# Patient Record
Sex: Female | Born: 1985 | Race: White | Hispanic: No | Marital: Single | Smoking: Never smoker
Health system: Southern US, Community
[De-identification: ages and names within clinical notes are randomized; demographics above are authoritative.]

---

## 2016-02-26 ENCOUNTER — Emergency Department (HOSPITAL_COMMUNITY)
Admission: EM | Admit: 2016-02-26 | Discharge: 2016-02-26 | Disposition: A | Discharge status: Home / Self Care | Payer: 59 | Attending: Emergency Medicine | Admitting: Emergency Medicine

## 2016-02-26 ENCOUNTER — Encounter (HOSPITAL_COMMUNITY): Payer: Self-pay | Admitting: Emergency Medicine

## 2016-02-26 DIAGNOSIS — Y92488 Other paved roadways as the place of occurrence of the external cause: Secondary | ICD-10-CM | POA: Diagnosis not present

## 2016-02-26 DIAGNOSIS — M545 Low back pain: Secondary | ICD-10-CM | POA: Insufficient documentation

## 2016-02-26 DIAGNOSIS — Y999 Unspecified external cause status: Secondary | ICD-10-CM | POA: Diagnosis not present

## 2016-02-26 DIAGNOSIS — Y939 Activity, unspecified: Secondary | ICD-10-CM | POA: Diagnosis not present

## 2016-02-26 MED ORDER — NAPROXEN 500 MG PO TABS: 500.0000 mg | ORAL_TABLET | Freq: Two times a day (BID) | ORAL | Status: AC

## 2016-02-26 MED ORDER — CYCLOBENZAPRINE HCL 10 MG PO TABS: 10.0000 mg | ORAL_TABLET | Freq: Two times a day (BID) | ORAL | Status: AC | PRN
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
## 2016-02-26 NOTE — ED Notes (Signed)
Pt ambulated to BR without diffculty
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
## 2016-02-26 NOTE — Discharge Instructions (Signed)
You'll be sore for several days. Medication for muscle spasm and anti-inflammatory effect.

## 2017-04-23 ENCOUNTER — Other Ambulatory Visit: Payer: Self-pay | Admitting: Otolaryngology

## 2017-04-23 DIAGNOSIS — J329 Chronic sinusitis, unspecified: Secondary | ICD-10-CM

## 2017-04-26 ENCOUNTER — Ambulatory Visit
Admission: RE | Admit: 2017-04-26 | Discharge: 2017-04-26 | Disposition: A | Discharge status: Home / Self Care | Payer: 59 | Source: Ambulatory Visit | Attending: Otolaryngology | Admitting: Otolaryngology

## 2017-04-26 DIAGNOSIS — J329 Chronic sinusitis, unspecified: Secondary | ICD-10-CM

## 2018-06-16 IMAGING — CT CT MAXILLOFACIAL W/O CM
1 series · 15 of 30 positions shown, 19 images · non-contrast
Comparison: None.

CLINICAL DATA: 31 y/o F; chronic sinusitis for years. Finished
antibiotics. Persistent postnasal drip and cough.

EXAM:
CT MAXILLOFACIAL WITHOUT CONTRAST
TECHNIQUE: Multidetector CT images of the paranasal sinuses were obtained using
the standard protocol without intravenous contrast.

[Series 4: soft tissue · axial · 0.44mm/px · z∈[-198,-40]mm · 15 of 171 slices shown, 19 images]
[im 6/171  brain]
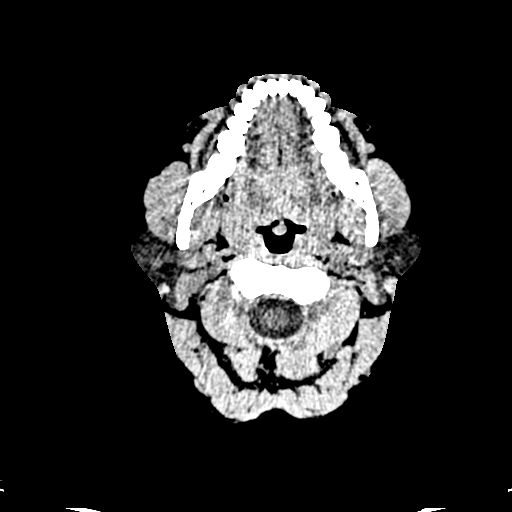
[im 6/171  bone]
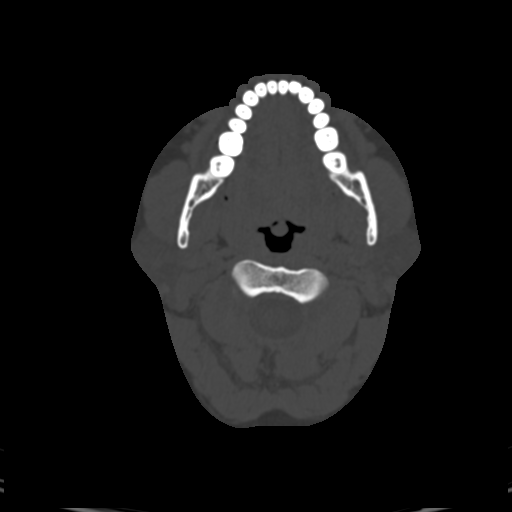
[im 18/171  bone]
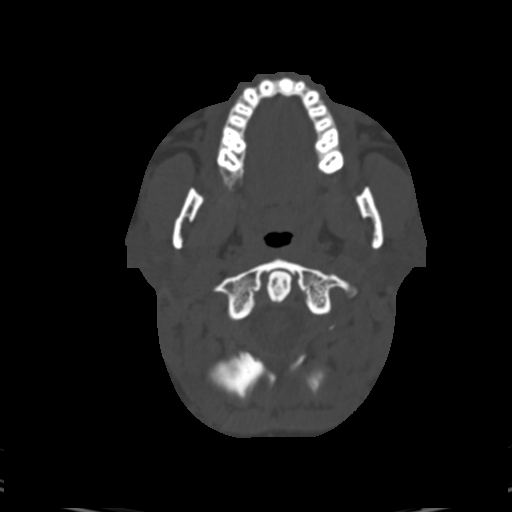
[im 30/171  bone]
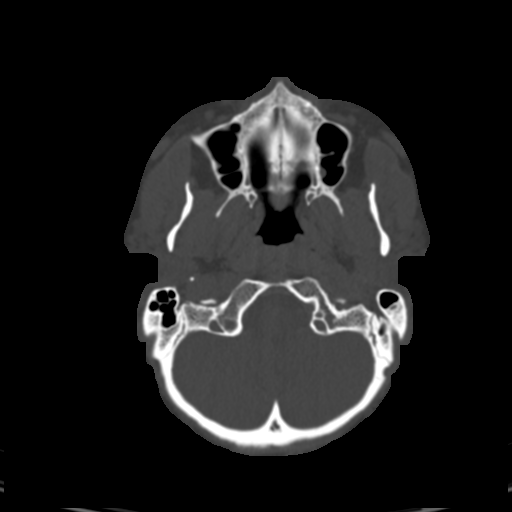
[im 42/171  bone]
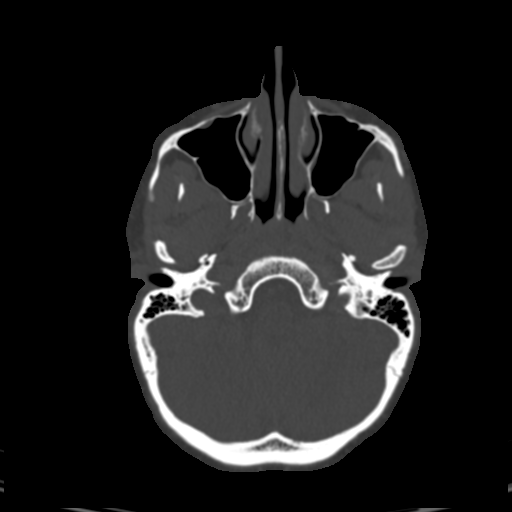
[im 53/171  brain]
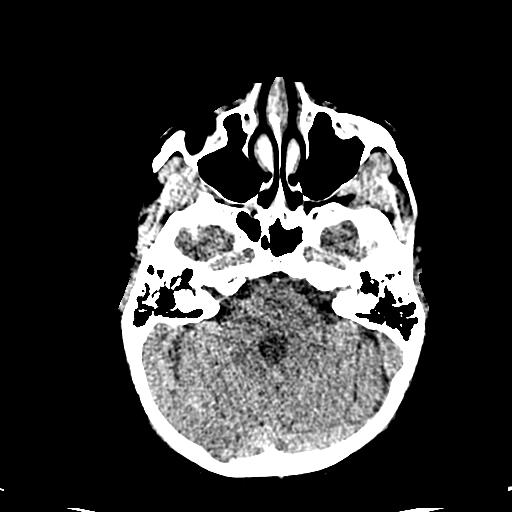
[im 53/171  bone]
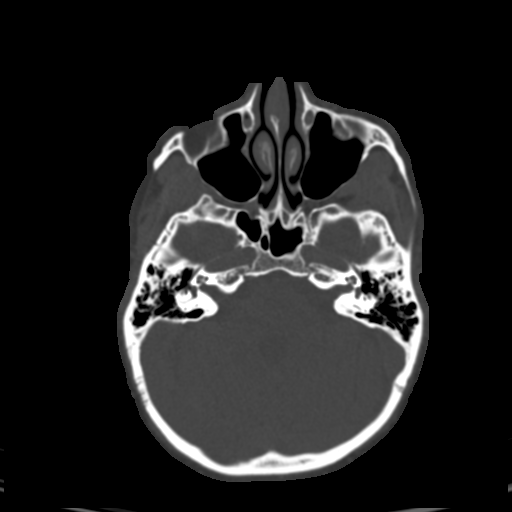
[im 65/171  bone]
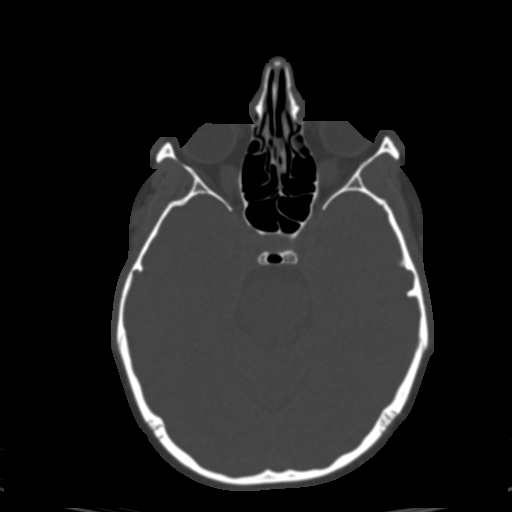
[im 77/171  bone]
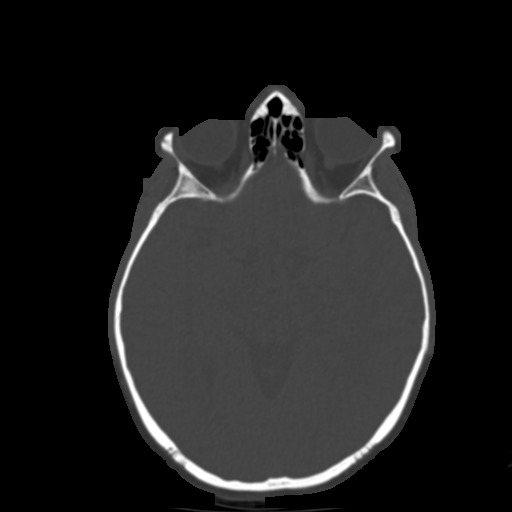
[im 88/171  bone]
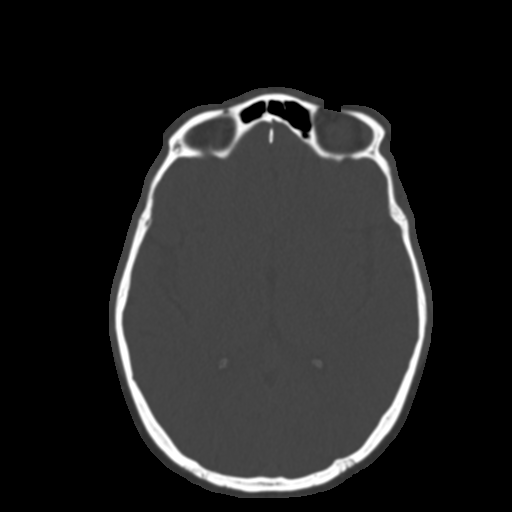
[im 94/171  brain]
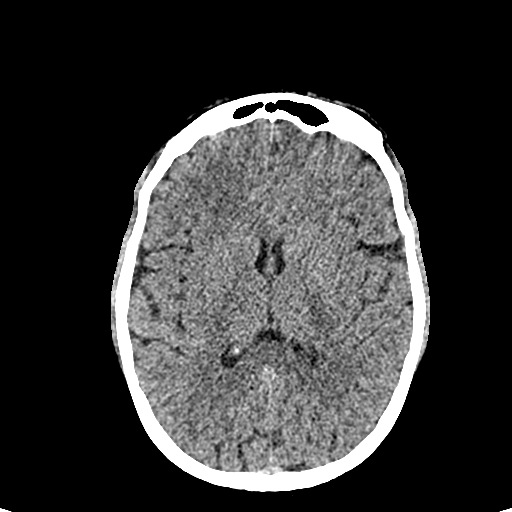
[im 94/171  bone]
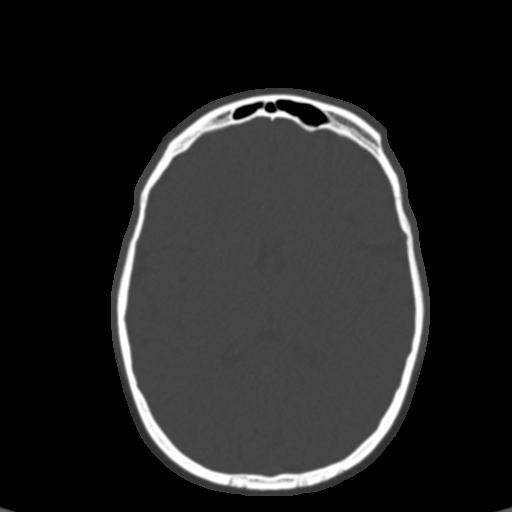
[im 106/171  bone]
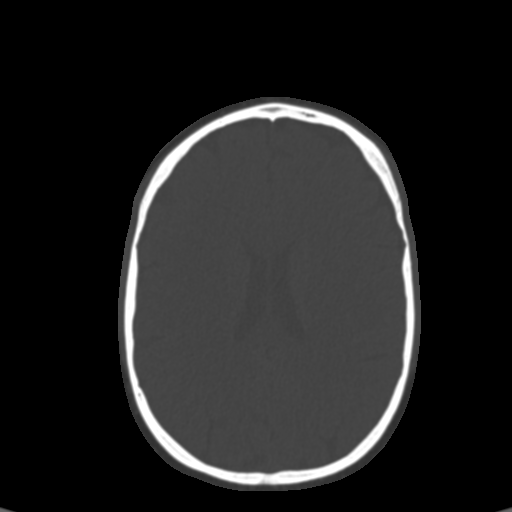
[im 118/171  bone]
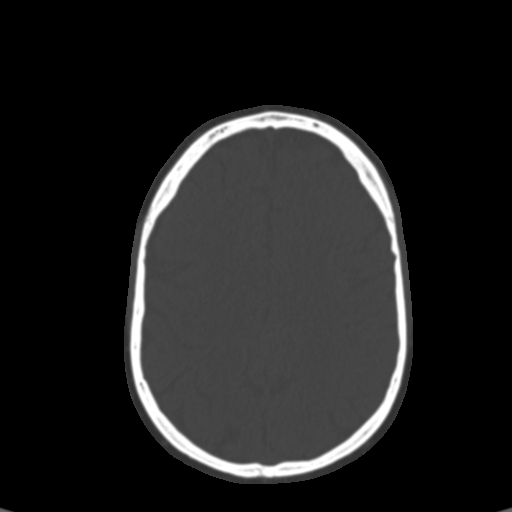
[im 129/171  bone]
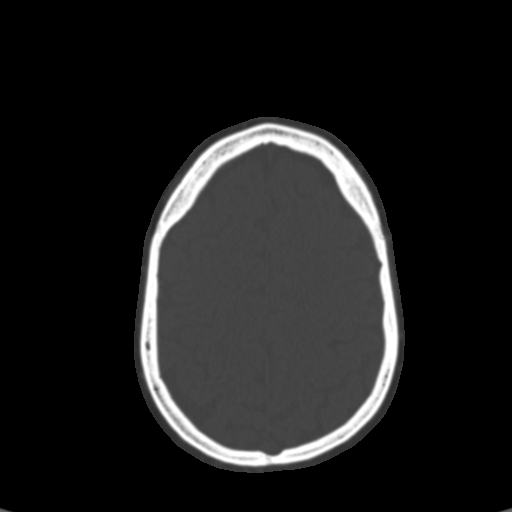
[im 141/171  brain]
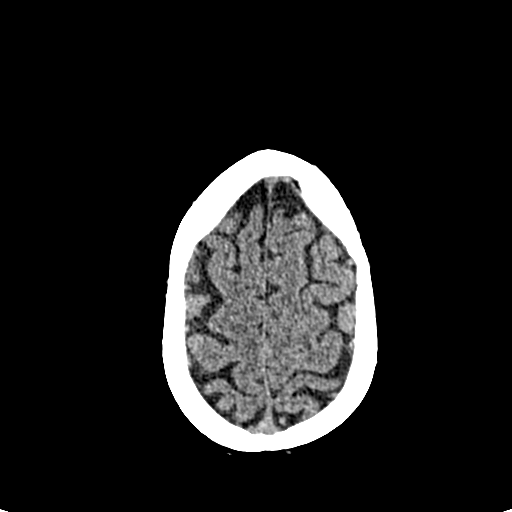
[im 141/171  bone]
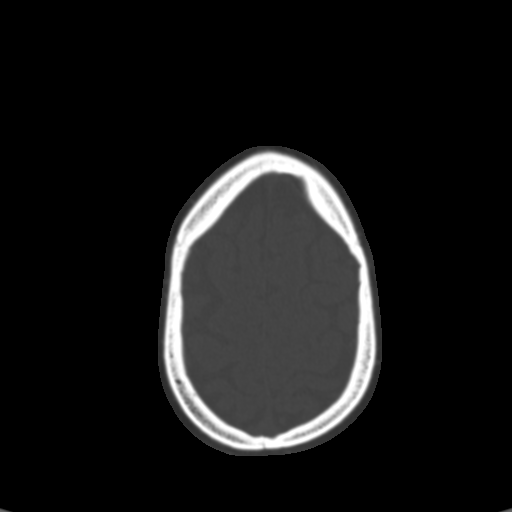
[im 153/171  bone]
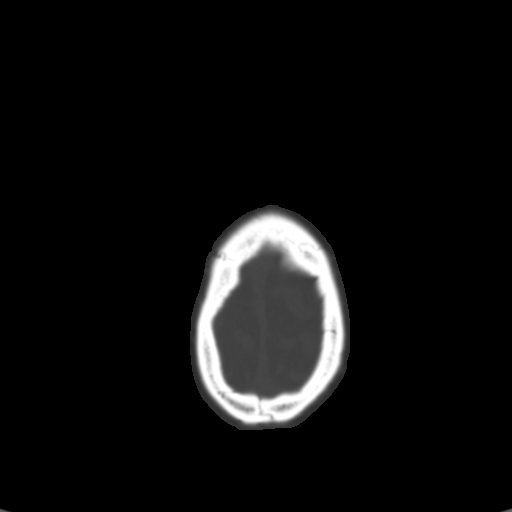
[im 165/171  bone]
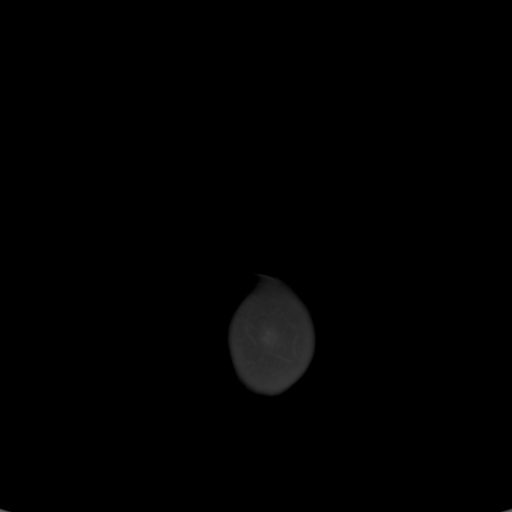

[15 of 30 positions shown; findings below may reference images not displayed]

FINDINGS: Paranasal sinuses:

Frontal: Normally aerated. Patent frontal sinus drainage pathways.

Ethmoid: Normally aerated.

Maxillary: Minimal right anterior maxillary sinus mucosal
thickening. Small mucous retention cysts within the left posterior
maxillary sinus.

Sphenoid: Normally aerated. Patent sphenoethmoidal recesses.
Pneumatization of the right lateral recess.

Right ostiomeatal unit: Patent. Mild mucosal thickening of the
infundibulum.

Left ostiomeatal unit: Patent. 5 mm Haller cell without significant
encroachment on the infundibulum.

Nasal passages: Patent. Intact nasal septum is midline. Minimal
rightward anterior nasal septal deviation. Right concha bullosa.

Anatomy: Pneumatization superior to the anterior ethmoid notches,
minimal on the right (series 5, image 34). Right olfactory groove is
1 mm higher to the left and right fovea ethmoidalis is 2 mm higher
than the left. Keros II bilaterally. Sellar sphenoid pneumatization
pattern. No dehiscence of carotid or optic canals. No onodi cell.

Other: Orbits and intracranial compartment are unremarkable. Visible
mastoid air cells are normally aerated.
IMPRESSION: Minimal maxillary sinus disease.  Patent sinus drainage pathways.

By: Ruf Gehlot M.D.
